# Patient Record
Sex: Female | Born: 2000 | Race: White | Hispanic: No | Marital: Single
Health system: Southern US, Community
[De-identification: ages and names within clinical notes are randomized; demographics above are authoritative.]

---
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
## 2021-03-24 ENCOUNTER — Telehealth: Payer: Self-pay

## 2021-03-24 NOTE — Telephone Encounter (Signed)
Mom was not able to get an appt. For today she is asking for at home treatment advice. Pt. Is running fever, runny nose, slight cough , and runny nose. Mom is concerned and would like to treat her at home until she is able to get and appt. Here.  ?

## 2021-03-24 NOTE — Telephone Encounter (Signed)
Encounter for correct patient has been sent in . Thank you.  ?
